# Patient Record
Sex: Female | Born: 1980 | Race: Black or African American | Hispanic: No | Marital: Single | Smoking: Never smoker
Health system: Southern US, Community
[De-identification: ages and names within clinical notes are randomized; demographics above are authoritative.]

## PROBLEM LIST (undated history)

## (undated) HISTORY — PX: OTHER SURGICAL HISTORY: SHX169

---

## 2007-04-13 ENCOUNTER — Ambulatory Visit: Payer: Self-pay

## 2007-09-25 ENCOUNTER — Inpatient Hospital Stay: Payer: Self-pay | Admitting: Unknown Physician Specialty

## 2010-12-16 ENCOUNTER — Emergency Department: Payer: Self-pay | Admitting: Emergency Medicine

## 2011-02-01 ENCOUNTER — Other Ambulatory Visit: Payer: Self-pay | Admitting: Internal Medicine

## 2011-02-08 ENCOUNTER — Ambulatory Visit: Payer: Self-pay | Admitting: Internal Medicine

## 2012-12-06 IMAGING — CR DG CHEST 1V PORT
1 series · 1 of 1 positions shown · non-contrast
Comparison: none

REASON FOR EXAM: chest pain
COMMENTS:

[view not recorded]
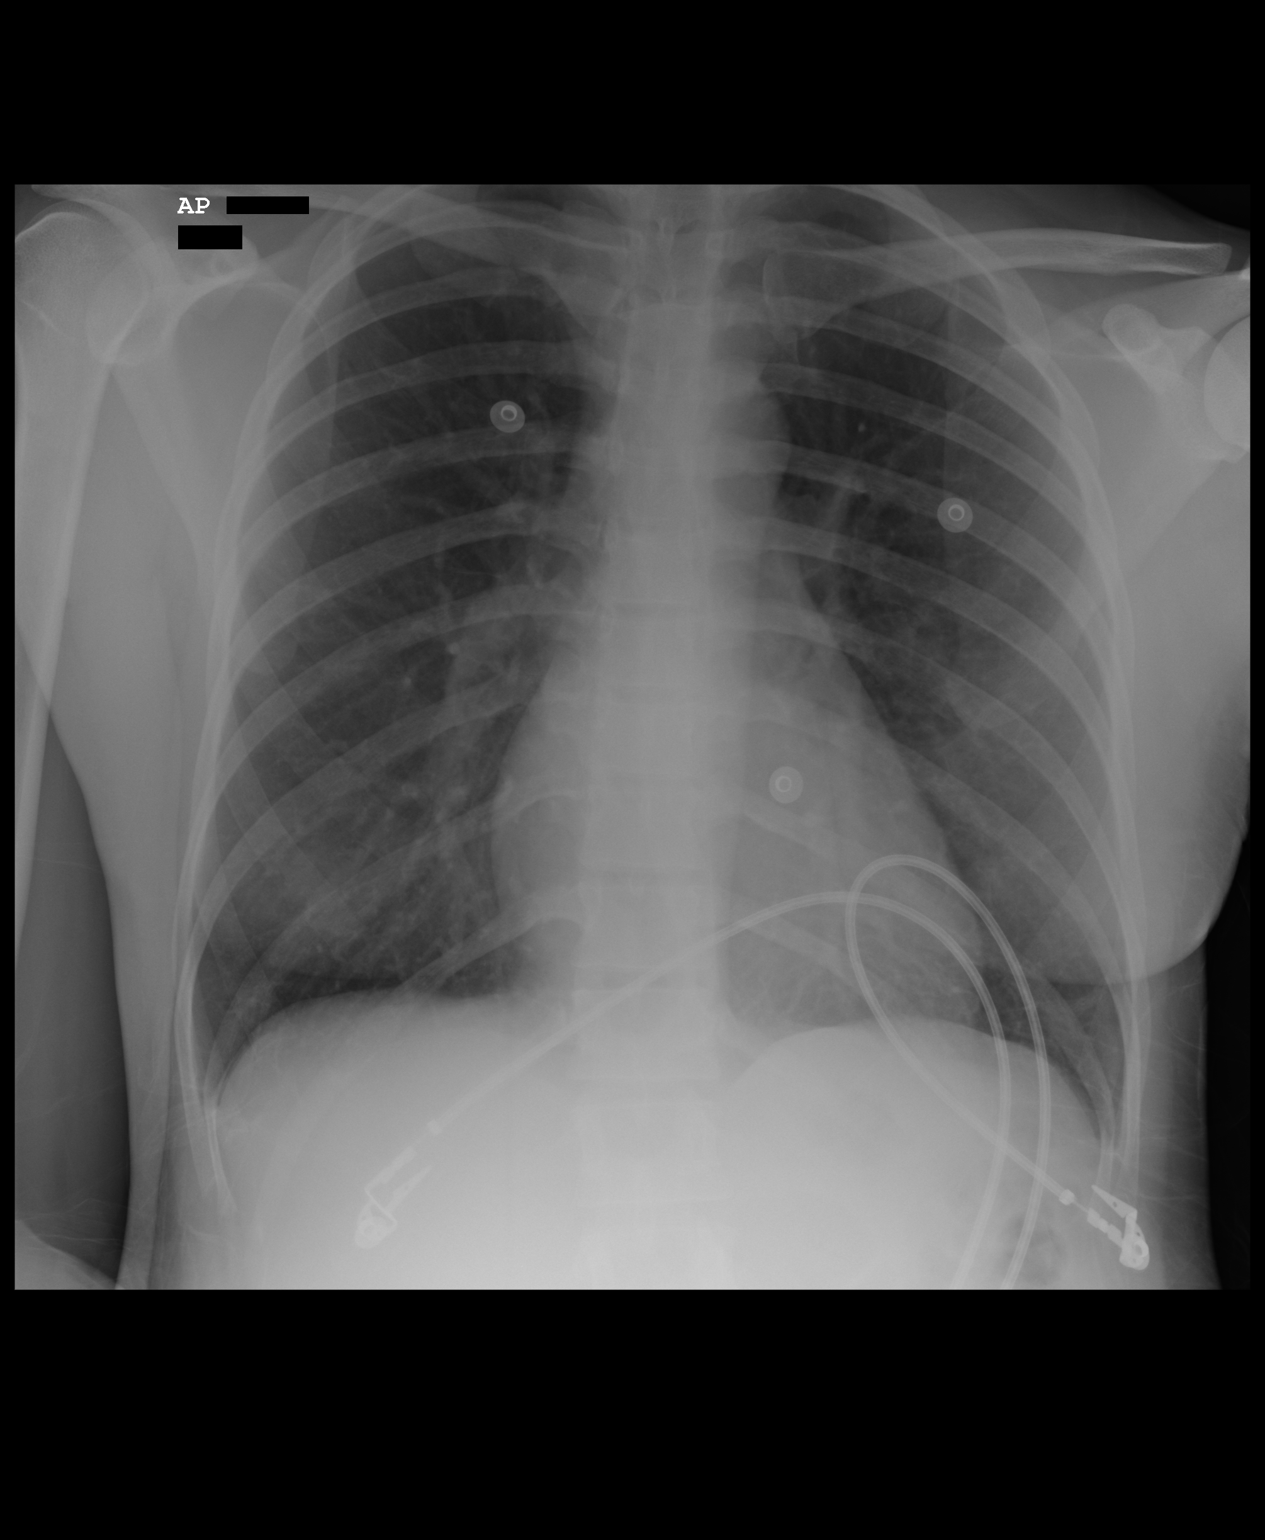

[1 of 1 positions shown; findings below may reference images not displayed]

PROCEDURE:     DXR - DXR PORTABLE CHEST SINGLE VIEW  - December 16, 2010  [DATE]

RESULT:     There is no previous exam for comparison.

The lungs are clear. The heart and pulmonary vessels are normal. The bony
and mediastinal structures are unremarkable. There is no effusion. There is
no pneumothorax or evidence of congestive failure.
IMPRESSION: No acute cardiopulmonary disease.

## 2013-01-29 IMAGING — NM NM MYOCARD GATED
1 series · 1 of 1 positions shown · non-contrast
Comparison: none

REASON FOR EXAM: angina chest pain
COMMENTS:

PROCEDURE:     NM  - NM GATED MYOCARDIAL ST SCAN [DATE]OF[DATE]  - [DATE]  [DATE] [DATE] [DATE]
RESULT:
REFERRING PHYSICIAN:  Emergency Room, Dr. Marlacoral
The patient was brought to the [facility] on 02-08-11.
INDICATION: Chest pain.

[Series 1000: save_screens · 1 of 1 slices shown]
[im 1/1]
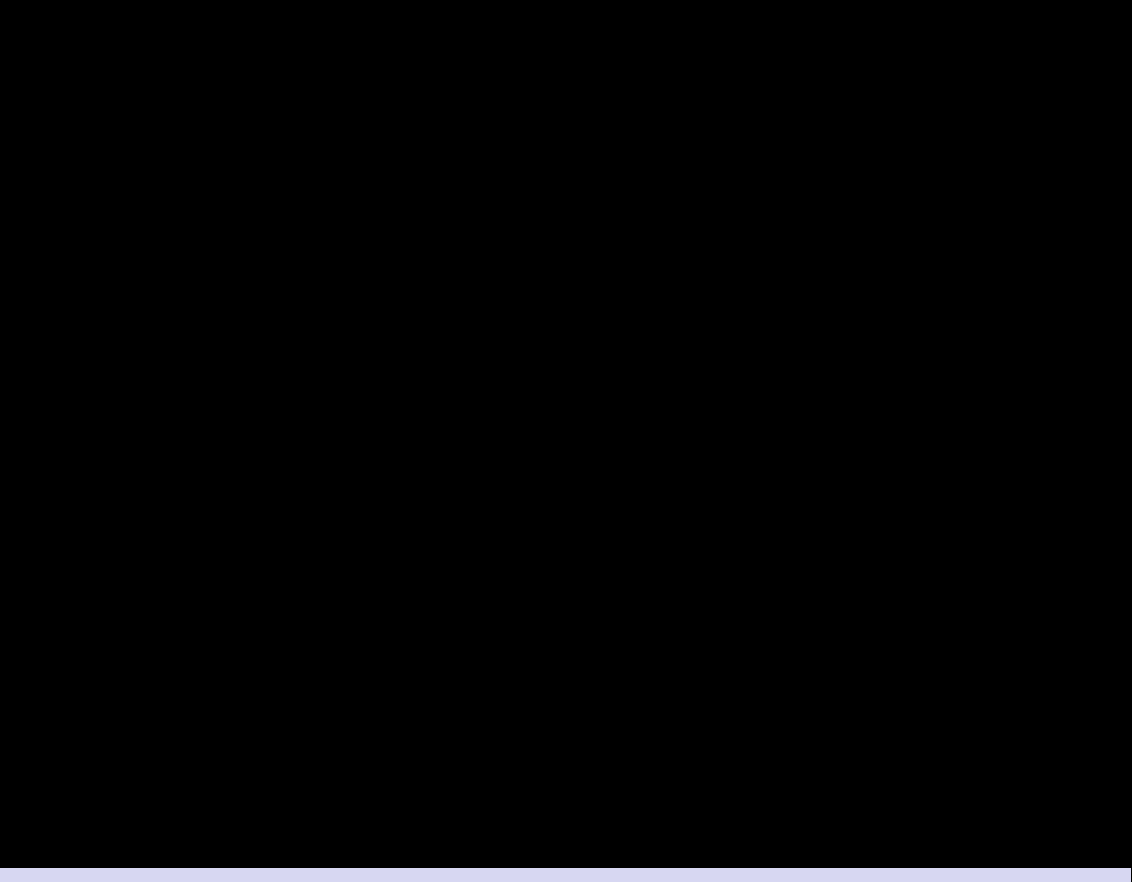

[1 of 1 positions shown; findings below may reference images not displayed]

The patient was exercised in a standard Bruce protocol. Baseline blood
pressure was 132/90, pulse 67. EKG normal sinus rhythm, nonspecific
findings. She exercised fro a total of 10 minutes. Peak heart rate was 162.
The patient denied any chest pain during exercise. At peak exercise she was
injected with 30.87 mCi of Myoview. Resting images were obtained with 13.70.
Upon completion of exercise she was taken to the Myoview camera for rest and
stress images. Gray scale, color and SPECT images suggest adequate uptake
and washout of Myoview. No significant stress induced defects. Ejection
fraction was 71% with normal wall motion.
IMPRESSION: 1.Negative scan.

## 2016-03-27 DIAGNOSIS — H5213 Myopia, bilateral: Secondary | ICD-10-CM | POA: Diagnosis not present

## 2016-04-07 DIAGNOSIS — Z308 Encounter for other contraceptive management: Secondary | ICD-10-CM | POA: Diagnosis not present

## 2016-04-13 DIAGNOSIS — B353 Tinea pedis: Secondary | ICD-10-CM | POA: Diagnosis not present

## 2016-04-13 DIAGNOSIS — B351 Tinea unguium: Secondary | ICD-10-CM | POA: Diagnosis not present

## 2016-04-13 DIAGNOSIS — L603 Nail dystrophy: Secondary | ICD-10-CM | POA: Diagnosis not present

## 2016-04-13 DIAGNOSIS — B359 Dermatophytosis, unspecified: Secondary | ICD-10-CM | POA: Diagnosis not present

## 2016-04-22 DIAGNOSIS — Z3049 Encounter for surveillance of other contraceptives: Secondary | ICD-10-CM | POA: Diagnosis not present

## 2017-02-02 ENCOUNTER — Encounter (HOSPITAL_COMMUNITY): Payer: Self-pay | Admitting: *Deleted

## 2017-02-02 DIAGNOSIS — Z202 Contact with and (suspected) exposure to infections with a predominantly sexual mode of transmission: Secondary | ICD-10-CM | POA: Diagnosis not present

## 2017-02-02 NOTE — ED Triage Notes (Signed)
Pt had exposure to STD, requesting testing, no symptoms

## 2017-02-03 ENCOUNTER — Emergency Department (HOSPITAL_COMMUNITY)
Admission: EM | Admit: 2017-02-03 | Discharge: 2017-02-03 | Disposition: A | Discharge status: Home / Self Care | Payer: BLUE CROSS/BLUE SHIELD | Attending: Emergency Medicine | Admitting: Emergency Medicine

## 2017-02-03 DIAGNOSIS — Z202 Contact with and (suspected) exposure to infections with a predominantly sexual mode of transmission: Secondary | ICD-10-CM

## 2017-02-03 LAB — RPR: RPR: NONREACTIVE

## 2017-02-03 LAB — I-STAT BETA HCG BLOOD, ED (MC, WL, AP ONLY)

## 2017-02-03 LAB — HIV ANTIBODY (ROUTINE TESTING W REFLEX): HIV Screen 4th Generation wRfx: NONREACTIVE

## 2017-02-03 MED ORDER — AZITHROMYCIN 1 G PO PACK
1.0000 g | PACK | Freq: Once | ORAL | Status: AC
  Administered 2017-02-03: 1 g via ORAL
  Filled 2017-02-03: qty 1

## 2017-02-03 MED ORDER — CEFTRIAXONE SODIUM 250 MG IJ SOLR
250.0000 mg | Freq: Once | INTRAMUSCULAR | Status: AC
  Administered 2017-02-03: 250 mg via INTRAMUSCULAR
  Filled 2017-02-03: qty 250

## 2017-02-03 NOTE — ED Notes (Signed)
PA at bedside.

## 2017-02-03 NOTE — Discharge Instructions (Signed)
As discussed, please avoid sexual intercourse for at least one week. See your gynecologist if you experience any symptoms including itching, abnormal discharge or lower abdominal pain.

## 2017-02-03 NOTE — ED Provider Notes (Signed)
[facility]-EMERGENCY DEPT Provider Note   CSN: [id number] Arrival date & time: 02/02/17  2340     History   Chief Complaint Chief Complaint  Patient presents with  . Exposure to STD    HPI Kerri Perez is a 36 y.o. female presenting for STD check after her partner was unfaithful and diagnosed with chlamydia yesterday. Patient denies any symptoms at all. She just wanted to be tested.  HPI  History reviewed. No pertinent past medical history.  There are no active problems to display for this patient.   History reviewed. No pertinent surgical history.  OB History    No data available       Home Medications    Prior to Admission medications   Not on File    Family History No family history on file.  Social History Social History  Substance Use Topics  . Smoking status: Never Smoker  . Smokeless tobacco: Never Used  . Alcohol use Yes     Allergies   Citrus   Review of Systems Review of Systems  Constitutional: Negative for chills and fever.  Genitourinary: Negative for genital sores and pelvic pain.  All other systems reviewed and are negative.    Physical Exam Updated Vital Signs BP 124/67 (BP Location: Left Arm)   Pulse 87   Temp 98.2 F (36.8 C) (Oral)   Resp 16   LMP 01/31/2017   SpO2 100%   Physical Exam  Constitutional: She appears well-developed and well-nourished. No distress.  Afebrile, nontoxic-appearing, sitting comfortably in bed in no acute distress.  HENT:  Head: Normocephalic and atraumatic.  Eyes: Conjunctivae and EOM are normal. Right eye exhibits no discharge. Left eye exhibits no discharge.  Neck: Normal range of motion.  Cardiovascular: Normal rate and regular rhythm.   Pulmonary/Chest: Effort normal and breath sounds normal. No respiratory distress. She has no wheezes. She has no rales.  Abdominal: Soft. She exhibits no distension. There is no tenderness.  Musculoskeletal: She exhibits no edema.  Neurological: She is  alert.  Skin: Skin is warm and dry. No rash noted. She is not diaphoretic. No erythema. No pallor.  Psychiatric: She has a normal mood and affect. Her behavior is normal.  Nursing note and vitals reviewed.    ED Treatments / Results  Labs (all labs ordered are listed, but only abnormal results are displayed) Labs Reviewed  WET PREP, GENITAL  RPR  HIV ANTIBODY (ROUTINE TESTING)  I-STAT BETA HCG BLOOD, ED ([facility], [facility], [facility] ONLY)  GC/CHLAMYDIA PROBE AMP ([place]) NOT AT [facility]    EKG  EKG Interpretation None       Radiology No results found.  Procedures Procedures (including critical care time)  Medications Ordered in ED Medications  azithromycin (ZITHROMAX) powder 1 g (not administered)  cefTRIAXone (ROCEPHIN) injection 250 mg (not administered)     Initial Impression / Assessment and Plan / ED Course  I have reviewed the triage vital signs and the nursing notes.  Pertinent labs & imaging results that were available during my care of the patient were reviewed by me and considered in my medical decision making (see chart for details).     Patient presents after her partner was diagnosed with chlamydia yesterday. She wants to be tested but has no symptoms.  Ordered STD panel including HIV, RPR. We'll treat him empirically for gonorrhea and chlamydia in ED. Patient is currently on her cycle. Discussed with patient that pelvic exam was unlikely to provide any information that would  change my management at this point. Patient is on her cycle and wetprep is unlikely to be conclusive. She is completely asymptomatic.Patient declined pelvic exam today. She was treated empirically for Gon/Chlam and tested for pregnancy, hiv and RPR.  Advised patient to be seen by her gynecologist if she experiences any abnormal discharge or symptoms.  Patient was well-appearing and nontoxic and stable for discharge. Discussed strict return precautions. Patient was advised to return to the  emergency department if experiencing any new or worsening symptoms. Patient clearly understood instructions and agreed with discharge plan.  Final Clinical Impressions(s) / ED Diagnoses   Final diagnoses:  STD exposure    New Prescriptions New Prescriptions   No medications on file     Georgiana ShoreJessica B Avianna Moynahan, PA-C 02/03/17 0115    Tomasita CrumbleAdeleke Oni, MD 02/03/17 [phone]

## 2017-03-21 ENCOUNTER — Encounter: Payer: Self-pay | Admitting: Obstetrics and Gynecology

## 2017-03-21 ENCOUNTER — Ambulatory Visit (INDEPENDENT_AMBULATORY_CARE_PROVIDER_SITE_OTHER): Payer: BLUE CROSS/BLUE SHIELD | Admitting: Obstetrics and Gynecology

## 2017-03-21 VITALS — BP 138/88 | HR 77 | Ht 63.0 in | Wt 169.0 lb

## 2017-03-21 DIAGNOSIS — Z113 Encounter for screening for infections with a predominantly sexual mode of transmission: Secondary | ICD-10-CM

## 2017-03-21 NOTE — Patient Instructions (Signed)
Sexually Transmitted Disease A sexually transmitted disease (STD) is a disease or infection that may be passed (transmitted) from person to person, usually during sexual activity. This may happen by way of saliva, semen, blood, vaginal mucus, or urine. Common STDs include:  Gonorrhea.  Chlamydia.  Syphilis.  HIV and AIDS.  Genital herpes.  Hepatitis B and C.  Trichomonas.  Human papillomavirus (HPV).  Pubic lice.  Scabies.  Mites.  Bacterial vaginosis. What are the causes? An STD may be caused by bacteria, a virus, or parasites. STDs are often transmitted during sexual activity if one person is infected. However, they may also be transmitted through nonsexual means. STDs may be transmitted after:  Sexual intercourse with an infected person.  Sharing sex toys with an infected person.  Sharing needles with an infected person or using unclean piercing or tattoo needles.  Having intimate contact with the genitals, mouth, or rectal areas of an infected person.  Exposure to infected fluids during birth. What are the signs or symptoms? Different STDs have different symptoms. Some people may not have any symptoms. If symptoms are present, they may include:  Painful or bloody urination.  Pain in the pelvis, abdomen, vagina, anus, throat, or eyes.  A skin rash, itching, or irritation.  Growths, ulcerations, blisters, or sores in the genital and anal areas.  Abnormal vaginal discharge with or without bad odor.  Penile discharge in men.  Fever.  Pain or bleeding during sexual intercourse.  Swollen glands in the groin area.  Yellow skin and eyes (jaundice). This is seen with hepatitis.  Swollen testicles.  Infertility.  Sores and blisters in the mouth. How is this diagnosed? To make a diagnosis, your health care provider may:  Take a medical history.  Perform a physical exam.  Take a sample of any discharge to examine.  Swab the throat, cervix, opening to  the penis, rectum, or vagina for testing.  Test a sample of your first morning urine.  Perform blood tests.  Perform a Pap test, if this applies.  Perform a colposcopy.  Perform a laparoscopy. How is this treated? Treatment depends on the STD. Some STDs may be treated but not cured.  Chlamydia, gonorrhea, trichomonas, and syphilis can be cured with antibiotic medicine.  Genital herpes, hepatitis, and HIV can be treated, but not cured, with prescribed medicines. The medicines lessen symptoms.  Genital warts from HPV can be treated with medicine or by freezing, burning (electrocautery), or surgery. Warts may come back.  HPV cannot be cured with medicine or surgery. However, abnormal areas may be removed from the cervix, vagina, or vulva.  If your diagnosis is confirmed, your recent sexual partners need treatment. This is true even if they are symptom-free or have a negative culture or evaluation. They should not have sex until their health care providers say it is okay.  Your health care provider may test you for infection again 3 months after treatment. How is this prevented? Take these steps to reduce your risk of getting an STD:  Use latex condoms, dental dams, and water-soluble lubricants during sexual activity. Do not use petroleum jelly or oils.  Avoid having multiple sex partners.  Do not have sex with someone who has other sex partners.  Do not have sex with anyone you do not know or who is at high risk for an STD.  Avoid risky sex practices that can break your skin.  Do not have sex if you have open sores on your mouth or skin.    Avoid drinking too much alcohol or taking illegal drugs. Alcohol and drugs can affect your judgment and put you in a vulnerable position.  Avoid engaging in oral and anal sex acts.  Get vaccinated for HPV and hepatitis. If you have not received these vaccines in the past, talk to your health care provider about whether one or both might be  right for you.  If you are at risk of being infected with HIV, it is recommended that you take a prescription medicine daily to prevent HIV infection. This is called pre-exposure prophylaxis (PrEP). You are considered at risk if:  You are a man who has sex with other men (MSM).  You are a heterosexual man or woman and are sexually active with more than one partner.  You take drugs by injection.  You are sexually active with a partner who has HIV.  Talk with your health care provider about whether you are at high risk of being infected with HIV. If you choose to begin PrEP, you should first be tested for HIV. You should then be tested every 3 months for as long as you are taking PrEP. Contact a health care provider if:  See your health care provider.  Tell your sexual partner(s). They should be tested and treated for any STDs.  Do not have sex until your health care provider says it is okay. Get help right away if: Contact your health care provider right away if:  You have severe abdominal pain.  You are a man and notice swelling or pain in your testicles.  You are a woman and notice swelling or pain in your vagina. This information is not intended to replace advice given to you by your health care provider. Make sure you discuss any questions you have with your health care provider. Document Released: 02/12/2003 Document Revised: 06/11/2016 Document Reviewed: 06/12/2013 Elsevier Interactive Patient Education  2017 Elsevier Inc.  

## 2017-03-21 NOTE — Progress Notes (Signed)
Obstetrics & Gynecology Office Visit   Chief Complaint:  Chief Complaint  Patient presents with  . Gynecologic Exam    STD check  . Exposure to STD    History of Present Illness: 36 year old female presenting for STD check after recent possible STD exposure.  Her partner tested positive for chlamydia, the patient herself does not have any current symptoms.  Denies vaginal discharge, abdominal/pelvic pain, irregular menstrual bleeding.  She does have RPR and HIV testing from 1 month ago that was negative.  She has been with the same partner for 6 years.   Partner was appropriately treated   Review of Systems: Review of Systems  Constitutional: Negative for chills and fever.  HENT: Negative for congestion.   Respiratory: Negative for cough and shortness of breath.   Cardiovascular: Negative for chest pain and palpitations.  Gastrointestinal: Negative for abdominal pain, constipation, diarrhea, heartburn, nausea and vomiting.  Genitourinary: Negative for dysuria, frequency and urgency.  Skin: Negative for itching and rash.  Neurological: Negative for dizziness and headaches.  Endo/Heme/Allergies: Negative for polydipsia.  Psychiatric/Behavioral: Negative for depression.    Past Medical History:  History reviewed. No pertinent past medical history.  Past Surgical History:  History reviewed. No pertinent surgical history.  Gynecologic History: No LMP recorded. Patient has had an implant.  Obstetric History: [id number]  Family History:  Family History  Problem Relation Age of Onset  . Breast cancer Maternal Aunt 46  . Cancer Maternal Uncle   . Breast cancer Maternal Grandmother     Social History:  Social History   Social History  . Marital status: Single    Spouse name: N/A  . Number of children: N/A  . Years of education: N/A   Occupational History  . Not on file.   Social History Main Topics  . Smoking status: Never Smoker  . Smokeless tobacco: Never Used    . Alcohol use Yes  . Drug use: No  . Sexual activity: Yes    Birth control/ protection: Implant   Other Topics Concern  . Not on file   Social History Narrative  . No narrative on file    Allergies:  Allergies  Allergen Reactions  . Citrus     Medications: Prior to Admission medications   Not on File    Physical Exam Vitals:  Vitals:   03/21/17 1102  BP: 138/88  Pulse: 77   No LMP recorded. Patient has had an implant.  General: NAD HEENT: normocephalic, anicteric Pulmonary: No increased work of breathing  Genitourinary:  External: Normal external female genitalia.  Normal urethral meatus, normal  Bartholin's and Skene's glands.    Vagina: Normal vaginal mucosa, no evidence of prolapse.    Lymphatic: no evidence of inguinal lymphadenopathy Extremities: no edema, erythema, or tenderness Neurologic: Grossly intact Psychiatric: mood appropriate, affect full  Female chaperone present for pelvic portions of the physical exam  Assessment: 36 y.o. [id number] presenting for STI screening  Plan: Problem List Items Addressed This Visit    None    Visit Diagnoses    Screening for STD (sexually transmitted disease)    -  Primary   Relevant Orders   GC/Chlamydia Probe Amp   HEP, RPR, HIV Panel     Patient counseled on safe sex, we discussed the predictive value of aptima.  In addition exposure to one STI increases exposure to others so full panel of blood testing drawn today as well.  A total of 15 minutes  were spent in face-to-face contact with the patient during this encounter with over half of that time devoted to counseling and coordination of care.

## 2017-03-22 ENCOUNTER — Encounter: Payer: Self-pay | Admitting: Obstetrics and Gynecology

## 2017-03-22 LAB — HEP, RPR, HIV PANEL
HIV Screen 4th Generation wRfx: NONREACTIVE
Hepatitis B Surface Ag: NEGATIVE
RPR Ser Ql: NONREACTIVE

## 2017-03-23 ENCOUNTER — Encounter: Payer: Self-pay | Admitting: Obstetrics and Gynecology

## 2017-03-23 LAB — GC/CHLAMYDIA PROBE AMP
CHLAMYDIA, DNA PROBE: NEGATIVE
NEISSERIA GONORRHOEAE BY PCR: NEGATIVE

## 2017-10-26 DIAGNOSIS — H5213 Myopia, bilateral: Secondary | ICD-10-CM | POA: Diagnosis not present

## 2019-03-20 ENCOUNTER — Telehealth: Payer: Self-pay | Admitting: Obstetrics and Gynecology

## 2019-03-20 NOTE — Telephone Encounter (Signed)
Patient is schedule for nexplanon removal and reinsertion with AMS on 04/04/19 at 9:30

## 2019-03-21 NOTE — Telephone Encounter (Signed)
Noted. Will order to arrive by apt date/time. 

## 2019-04-04 ENCOUNTER — Ambulatory Visit (INDEPENDENT_AMBULATORY_CARE_PROVIDER_SITE_OTHER): Payer: BLUE CROSS/BLUE SHIELD | Admitting: Obstetrics and Gynecology

## 2019-04-04 ENCOUNTER — Other Ambulatory Visit: Payer: Self-pay

## 2019-04-04 ENCOUNTER — Encounter: Payer: Self-pay | Admitting: Obstetrics and Gynecology

## 2019-04-04 ENCOUNTER — Other Ambulatory Visit (HOSPITAL_COMMUNITY)
Admission: RE | Admit: 2019-04-04 | Discharge: 2019-04-04 | Disposition: A | Discharge status: Home / Self Care | Payer: BLUE CROSS/BLUE SHIELD | Source: Ambulatory Visit | Attending: Obstetrics and Gynecology | Admitting: Obstetrics and Gynecology

## 2019-04-04 VITALS — BP 130/86 | Ht 63.0 in | Wt 158.0 lb

## 2019-04-04 DIAGNOSIS — Z1239 Encounter for other screening for malignant neoplasm of breast: Secondary | ICD-10-CM

## 2019-04-04 DIAGNOSIS — Z3049 Encounter for surveillance of other contraceptives: Secondary | ICD-10-CM

## 2019-04-04 DIAGNOSIS — Z3046 Encounter for surveillance of implantable subdermal contraceptive: Secondary | ICD-10-CM

## 2019-04-04 DIAGNOSIS — Z124 Encounter for screening for malignant neoplasm of cervix: Secondary | ICD-10-CM | POA: Insufficient documentation

## 2019-04-04 NOTE — Progress Notes (Signed)
Obstetrics & Gynecology Office Visit   Chief Complaint:  Chief Complaint  Patient presents with   Contraception    Nexplanon removal reinsert    History of Present Illness: 38 year old female presenting for nexplanon removal and reinsertion. She has had her current nexplanon for 3 years. Has been happy with cycle control on it.  She has not had a recent pap smear or start mammography screening.    Review of Systems: Review of Systems  Constitutional: Negative.   Genitourinary: Negative.   Skin: Negative.      Past Medical History:  History reviewed. No pertinent past medical history.  Past Surgical History:  Past Surgical History:  Procedure Laterality Date   Nexplanon  2014, 2017, 2020    Gynecologic History: No LMP recorded. Patient has had an implant.  Obstetric History: [id number]  Family History:  Family History  Problem Relation Age of Onset   Breast cancer Maternal Aunt 57   Cancer Maternal Uncle    Breast cancer Maternal Grandmother     Social History:  Social History   Socioeconomic History   Marital status: Single    Spouse name: Not on file   Number of children: Not on file   Years of education: Not on file   Highest education level: Not on file  Occupational History   Not on file  Social Needs   Financial resource strain: Not on file   Food insecurity:    Worry: Not on file    Inability: Not on file   Transportation needs:    Medical: Not on file    Non-medical: Not on file  Tobacco Use   Smoking status: Never Smoker   Smokeless tobacco: Never Used  Substance and Sexual Activity   Alcohol use: Yes   Drug use: No   Sexual activity: Yes    Birth control/protection: Implant  Lifestyle   Physical activity:    Days per week: Not on file    Minutes per session: Not on file   Stress: Not on file  Relationships   Social connections:    Talks on phone: Not on file    Gets together: Not on file    Attends  religious service: Not on file    Active member of club or organization: Not on file    Attends meetings of clubs or organizations: Not on file    Relationship status: Not on file   Intimate partner violence:    Fear of current or ex partner: Not on file    Emotionally abused: Not on file    Physically abused: Not on file    Forced sexual activity: Not on file  Other Topics Concern   Not on file  Social History Narrative   Not on file    Allergies:  Allergies  Allergen Reactions   Citrus     Medications: Prior to Admission medications   Medication Sig Start Date End Date Taking? Authorizing Provider  etonogestrel (NEXPLANON) 68 MG IMPL implant 1 each by Subdermal route once.   Yes [provider]    Physical Exam Vitals:  Vitals:   04/04/19 0937  BP: 130/86   No LMP recorded. Patient has had an implant.  General: NAD, well nourished, appears stated age HEENT: normocephalic, anicteric Pulmonary: No increased work of breathing Genitourinary:  External: Normal external female genitalia.  Normal urethral meatus, normal  Bartholin's and Skene's glands.    Vagina: Normal vaginal mucosa, no evidence of prolapse.  Cervix: Grossly normal in appearance, no bleeding  Uterus: Non-enlarged, mobile, normal contour.  No CMT  Adnexa: ovaries non-enlarged, no adnexal masses  Rectal: deferred  Lymphatic: no evidence of inguinal lymphadenopathy Extremities: no edema, erythema, or tenderness Neurologic: Grossly intact Psychiatric: mood appropriate, affect full  Female chaperone present for pelvic  portions of the physical exam   GYNECOLOGY PROCEDURE NOTE  Patient is a 38 y.o. [id number] presenting for Nexplanon insertion as her desires means of contraception.  She provided informed consent, signed copy in the chart, time out was performed.   She understands that Nexplanon is a progesterone only therapy, and that patients often patients have irregular and unpredictable  vaginal bleeding or amenorrhea. She understands that other side effects are possible related to systemic progesterone, including but not limited to, headaches, breast tenderness, nausea, and irritability. While effective at preventing pregnancy long acting reversible contraceptives do not prevent transmission of sexually transmitted diseases and use of barrier methods for this purpose was discussed. The placement procedure for Nexplanon was reviewed with the patient in detail including risks of nerve injury, infection, bleeding and injury to other muscles or tendons. She understands that the Nexplanon implant is good for 3 years and needs to be removed at the end of that time.  She understands that Nexplanon is an extremely effective option for contraception, with failure rate of <1%. This information is reviewed today and all questions were answered. Informed consent was obtained, both verbally and written.   The patient is healthy and has no contraindications to Implanon use. Urine pregnancy test was performed today and was negative.  Procedure Appropriate time out taken.  Patient placed in dorsal supine with left arm above head, elbow flexed at 90 degrees, arm resting on examination table.  The bicipital grove was palpated and site 8-10cm proximal to the medial epicondyle was indentified . The insertion site was prepped with a two betadine swabs and then injected with 3cc of1% lidocaine without epinephrine.   Implanon identified without problems.   Small 0.5cm incision made at distal tip of implanon device with 11 blade scalpel.  Implanon brought to incision and grasped with a small kelly clamp.  Implanon removed intact without problems. Nexplanon removed form sterile blister packaging,  Device confirmed in needle, before inserting full length of needle, tenting up the skin as the needle was advance.  The drug eluting rod was then deployed by pulling back the slider per the manufactures recommendation.  The  implant was palpable by the clinician as well as the patient.  The insertion site covered dressed with a band aid before applying  a kerlex bandage pressure dressing..Minimal blood loss was noted during the procedure.  The patientt tolerated the procedure well.   She was instructed to wear the bandage for 24 hours, call with any signs of infection.  She was given the Implanon card and instructed to have the rod removed in 3 years.  Assessment: 38 y.o. [id number] nexplanon removal and reinsertion, pap  Plan: Problem List Items Addressed This Visit    None    Visit Diagnoses    Screening for malignant neoplasm of cervix    -  Primary   Relevant Orders   Cytology - PAP   Encounter for Nexplanon removal       Breast screening       Relevant Orders   MM 3D SCREEN BREAST BILATERAL     1) Healthcare maintenance - pap collected, declines STI screening, Mammogram orered  2) Patient  given post procedure precautions and asked to call for fever, chills, redness or drainage from her incision, bleeding from incision.  She understands she will likely have a small bruise near site of removal and can remove bandage tomorrow and steri-strips in approximately 1 week.   Vena AustriaAndreas Kendon Sedeno, MD, Evern CoreFACOG [facility], [facility] 04/04/2019, 10:22 AM

## 2019-04-04 NOTE — Patient Instructions (Signed)
[facility] [address] [place]  [address]  [facility] [place]  [address] [place]  [address]  Phone: [phone]  

## 2019-04-12 LAB — CYTOLOGY - PAP
Adequacy: ABSENT
Diagnosis: NEGATIVE
HPV: NOT DETECTED

## 2020-02-25 NOTE — Telephone Encounter (Signed)
Nexplanon provided for this patient. Nexplanon rcvd/charged 04/04/2019

## 2020-05-06 DIAGNOSIS — H16102 Unspecified superficial keratitis, left eye: Secondary | ICD-10-CM | POA: Diagnosis not present

## 2020-05-06 DIAGNOSIS — T1512XA Foreign body in conjunctival sac, left eye, initial encounter: Secondary | ICD-10-CM | POA: Diagnosis not present

## 2020-09-04 DIAGNOSIS — H5213 Myopia, bilateral: Secondary | ICD-10-CM | POA: Diagnosis not present

## 2021-02-19 DIAGNOSIS — H5213 Myopia, bilateral: Secondary | ICD-10-CM | POA: Diagnosis not present

## 2022-12-20 ENCOUNTER — Telehealth: Payer: BLUE CROSS/BLUE SHIELD | Admitting: Physician Assistant

## 2022-12-20 DIAGNOSIS — Z202 Contact with and (suspected) exposure to infections with a predominantly sexual mode of transmission: Secondary | ICD-10-CM

## 2022-12-21 NOTE — Progress Notes (Signed)
Because of need for testing and that we cannot treat STI via e-visit, I feel your condition warrants further evaluation and I recommend that you be seen in a face to face visit.   NOTE: There will be NO CHARGE for this eVisit   If you are having a true medical emergency please call 911.      For an urgent face to face visit, [facility] has eight urgent care centers for your convenience:   NEW!! [facility] [place] [facility] [place] Get Driving Directions [phone] [address], [address] [place], [facility] [place] [facility] [place] Get Driving Directions [phone] [place], [place] [address]   [facility]) Get Driving Directions [phone] [address], [place] [address]  [facility] [place] ([place]) Get Driving Directions [phone] [address] [place],  [place]  [address]  [facility] at [address] Get Driving Directions  [phone] [address] [place],  [place] [address]   [facility] Get Driving Directions [phone] [address] [place], [place], [place] [address]   [facility] Get Driving Directions  [phone] [address] [place], [place] [address]   [facility] [place] Get Driving Directions [phone] [address] [place], [place] [address]  Your MyChart E-visit questionnaire answers were reviewed by a board certified advanced clinical practitioner to complete your personal care plan based on your specific symptoms.  Thank you for using e-Visits.
